# Patient Record
Sex: Female | Born: 1935 | Race: White | Hispanic: No | Marital: Married | Smoking: Never smoker
Health system: Southern US, Community
[De-identification: ages and names within clinical notes are randomized; demographics above are authoritative.]

## PROBLEM LIST (undated history)

## (undated) DIAGNOSIS — I10 Essential (primary) hypertension: Secondary | ICD-10-CM

## (undated) DIAGNOSIS — Z853 Personal history of malignant neoplasm of breast: Secondary | ICD-10-CM

## (undated) DIAGNOSIS — T7840XA Allergy, unspecified, initial encounter: Secondary | ICD-10-CM

## (undated) DIAGNOSIS — C801 Malignant (primary) neoplasm, unspecified: Secondary | ICD-10-CM

## (undated) DIAGNOSIS — I639 Cerebral infarction, unspecified: Secondary | ICD-10-CM

## (undated) DIAGNOSIS — I82409 Acute embolism and thrombosis of unspecified deep veins of unspecified lower extremity: Secondary | ICD-10-CM

## (undated) DIAGNOSIS — M1990 Unspecified osteoarthritis, unspecified site: Secondary | ICD-10-CM

## (undated) DIAGNOSIS — I519 Heart disease, unspecified: Secondary | ICD-10-CM

## (undated) HISTORY — DX: Malignant (primary) neoplasm, unspecified: C80.1

## (undated) HISTORY — PX: WRIST SURGERY: SHX841

## (undated) HISTORY — DX: Heart disease, unspecified: I51.9

## (undated) HISTORY — PX: ABDOMINAL HYSTERECTOMY: SHX81

## (undated) HISTORY — DX: Personal history of malignant neoplasm of breast: Z85.3

## (undated) HISTORY — DX: Essential (primary) hypertension: I10

## (undated) HISTORY — PX: PHRENIC NERVE PACEMAKER IMPLANTATION: SHX2237

## (undated) HISTORY — PX: HIP SURGERY: SHX245

## (undated) HISTORY — DX: Acute embolism and thrombosis of unspecified deep veins of unspecified lower extremity: I82.409

## (undated) HISTORY — PX: APPENDECTOMY: SHX54

## (undated) HISTORY — DX: Allergy, unspecified, initial encounter: T78.40XA

## (undated) HISTORY — DX: Unspecified osteoarthritis, unspecified site: M19.90

## (undated) HISTORY — DX: Cerebral infarction, unspecified: I63.9

---

## 2003-05-28 HISTORY — PX: COLONOSCOPY: SHX174

## 2003-05-28 HISTORY — PX: BREAST SURGERY: SHX581

## 2003-12-08 DIAGNOSIS — C801 Malignant (primary) neoplasm, unspecified: Secondary | ICD-10-CM

## 2003-12-08 HISTORY — DX: Malignant (primary) neoplasm, unspecified: C80.1

## 2004-02-27 ENCOUNTER — Ambulatory Visit: Payer: Self-pay | Admitting: Radiation Oncology

## 2004-03-22 ENCOUNTER — Ambulatory Visit: Payer: Self-pay | Admitting: General Surgery

## 2004-03-27 ENCOUNTER — Ambulatory Visit: Payer: Self-pay | Admitting: Radiation Oncology

## 2004-06-04 ENCOUNTER — Ambulatory Visit: Payer: Self-pay | Admitting: General Surgery

## 2004-07-18 ENCOUNTER — Ambulatory Visit: Payer: Self-pay | Admitting: Ophthalmology

## 2004-07-24 ENCOUNTER — Ambulatory Visit: Payer: Self-pay | Admitting: Ophthalmology

## 2004-07-30 ENCOUNTER — Ambulatory Visit: Payer: Self-pay | Admitting: Radiation Oncology

## 2004-11-26 ENCOUNTER — Ambulatory Visit: Payer: Self-pay | Admitting: General Surgery

## 2004-12-13 ENCOUNTER — Observation Stay: Payer: Self-pay | Admitting: Internal Medicine

## 2004-12-13 ENCOUNTER — Other Ambulatory Visit: Payer: Self-pay

## 2005-01-30 ENCOUNTER — Ambulatory Visit: Payer: Self-pay | Admitting: Radiation Oncology

## 2005-05-23 ENCOUNTER — Other Ambulatory Visit: Payer: Self-pay

## 2005-05-27 DIAGNOSIS — I82409 Acute embolism and thrombosis of unspecified deep veins of unspecified lower extremity: Secondary | ICD-10-CM

## 2005-05-27 HISTORY — DX: Acute embolism and thrombosis of unspecified deep veins of unspecified lower extremity: I82.409

## 2005-05-27 HISTORY — PX: KNEE SURGERY: SHX244

## 2005-05-28 ENCOUNTER — Inpatient Hospital Stay: Payer: Self-pay | Admitting: Specialist

## 2005-06-01 ENCOUNTER — Encounter: Payer: Self-pay | Admitting: Internal Medicine

## 2005-06-10 ENCOUNTER — Ambulatory Visit: Payer: Self-pay | Admitting: Internal Medicine

## 2005-06-27 ENCOUNTER — Encounter: Payer: Self-pay | Admitting: Internal Medicine

## 2005-07-29 ENCOUNTER — Ambulatory Visit: Payer: Self-pay | Admitting: General Surgery

## 2006-01-30 ENCOUNTER — Ambulatory Visit: Payer: Self-pay | Admitting: Radiation Oncology

## 2006-01-31 ENCOUNTER — Ambulatory Visit: Payer: Self-pay | Admitting: General Surgery

## 2006-03-06 ENCOUNTER — Other Ambulatory Visit: Payer: Self-pay

## 2006-03-06 ENCOUNTER — Inpatient Hospital Stay: Payer: Self-pay | Admitting: Specialist

## 2006-03-13 ENCOUNTER — Encounter: Payer: Self-pay | Admitting: Internal Medicine

## 2006-03-20 ENCOUNTER — Emergency Department: Payer: Self-pay | Admitting: Emergency Medicine

## 2006-03-27 ENCOUNTER — Encounter: Payer: Self-pay | Admitting: Internal Medicine

## 2006-03-31 ENCOUNTER — Ambulatory Visit: Payer: Self-pay | Admitting: Family Medicine

## 2006-04-07 ENCOUNTER — Ambulatory Visit: Payer: Self-pay | Admitting: Family Medicine

## 2006-04-09 ENCOUNTER — Ambulatory Visit: Payer: Self-pay | Admitting: Family Medicine

## 2006-04-11 ENCOUNTER — Ambulatory Visit: Payer: Self-pay | Admitting: Family Medicine

## 2006-04-15 ENCOUNTER — Ambulatory Visit: Payer: Self-pay | Admitting: Family Medicine

## 2006-04-21 ENCOUNTER — Ambulatory Visit: Payer: Self-pay | Admitting: Internal Medicine

## 2006-04-26 ENCOUNTER — Ambulatory Visit: Payer: Self-pay | Admitting: Internal Medicine

## 2006-04-26 ENCOUNTER — Encounter: Payer: Self-pay | Admitting: Internal Medicine

## 2006-05-27 ENCOUNTER — Encounter: Payer: Self-pay | Admitting: Internal Medicine

## 2006-05-27 ENCOUNTER — Ambulatory Visit: Payer: Self-pay | Admitting: Internal Medicine

## 2006-06-27 ENCOUNTER — Encounter: Payer: Self-pay | Admitting: Internal Medicine

## 2006-07-26 ENCOUNTER — Encounter: Payer: Self-pay | Admitting: Internal Medicine

## 2006-07-28 ENCOUNTER — Ambulatory Visit: Payer: Self-pay | Admitting: General Surgery

## 2006-08-06 ENCOUNTER — Ambulatory Visit: Payer: Self-pay | Admitting: Internal Medicine

## 2006-08-26 ENCOUNTER — Ambulatory Visit: Payer: Self-pay | Admitting: Internal Medicine

## 2006-08-26 ENCOUNTER — Encounter: Payer: Self-pay | Admitting: Internal Medicine

## 2006-09-25 ENCOUNTER — Encounter: Payer: Self-pay | Admitting: Internal Medicine

## 2006-10-26 ENCOUNTER — Encounter: Payer: Self-pay | Admitting: Internal Medicine

## 2006-11-25 ENCOUNTER — Encounter: Payer: Self-pay | Admitting: Internal Medicine

## 2006-12-26 ENCOUNTER — Encounter: Payer: Self-pay | Admitting: Internal Medicine

## 2007-01-05 ENCOUNTER — Ambulatory Visit: Payer: Self-pay | Admitting: General Surgery

## 2007-01-26 ENCOUNTER — Encounter: Payer: Self-pay | Admitting: Internal Medicine

## 2007-01-26 ENCOUNTER — Ambulatory Visit: Payer: Self-pay | Admitting: Internal Medicine

## 2007-02-04 ENCOUNTER — Ambulatory Visit: Payer: Self-pay | Admitting: Internal Medicine

## 2007-02-25 ENCOUNTER — Ambulatory Visit: Payer: Self-pay | Admitting: Internal Medicine

## 2007-02-25 ENCOUNTER — Encounter: Payer: Self-pay | Admitting: Internal Medicine

## 2007-03-28 ENCOUNTER — Encounter: Payer: Self-pay | Admitting: Internal Medicine

## 2007-04-27 ENCOUNTER — Encounter: Payer: Self-pay | Admitting: Internal Medicine

## 2007-06-27 IMAGING — US US EXTREM LOW VENOUS*L*
1 series · 17 of 22 positions shown · non-contrast
Comparison: none

REASON FOR EXAM: DVT  POST OP  rm 1
COMMENTS:

PROCEDURE:     US  - US DOPPLER LOW EXTR LEFT  - March 21, 2006  [DATE]
RESULT:     Standard LEFT lower extremity Doppler reveals diffuse
non-occlusive thrombus throughout the LEFT lower extremity deep venous
system.  Thrombus is also noted in the saphenous vein.

[Series 1: us extrem low venous*left* · 17 of 22 slices shown]
[im 1/22]
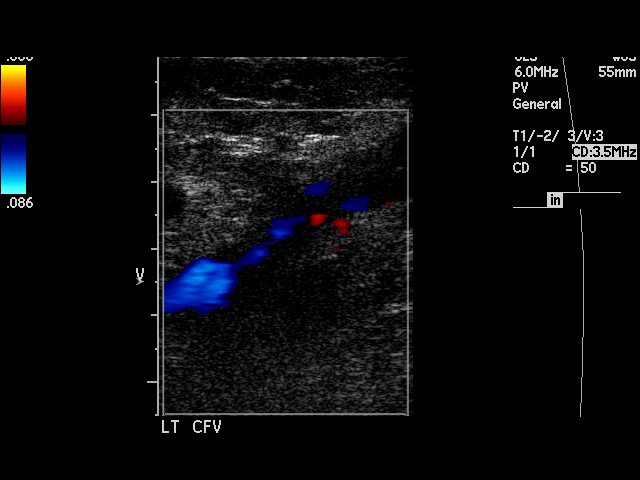
[im 2/22]
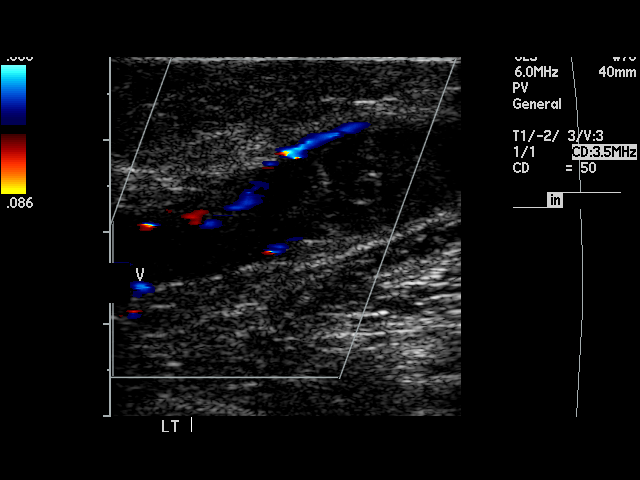
[im 4/22]
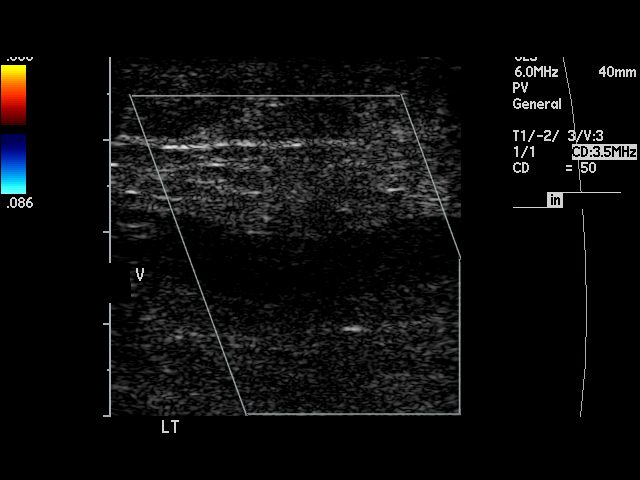
[im 5/22]
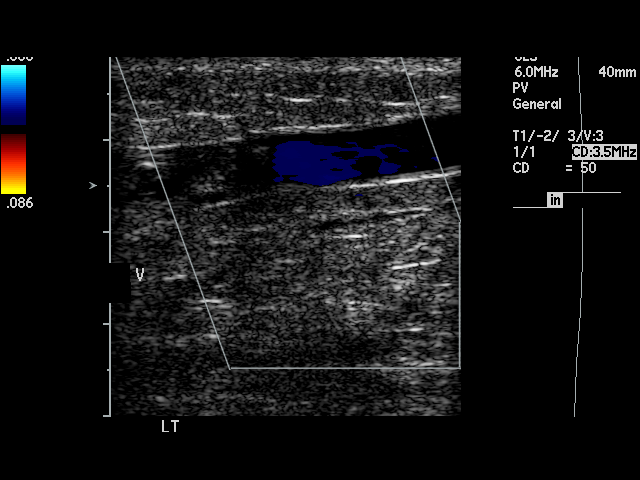
[im 6/22]
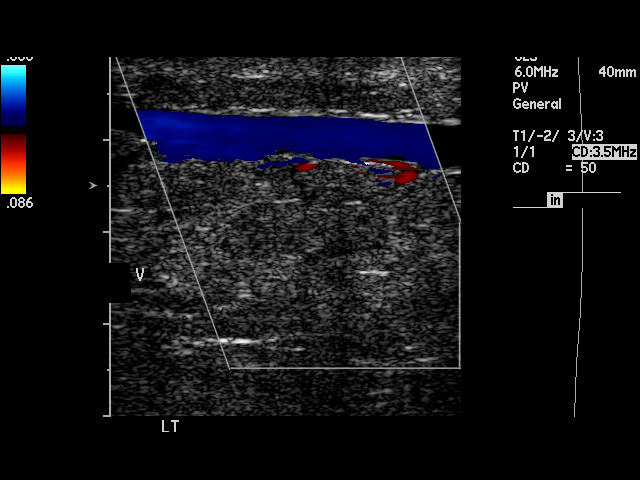
[im 8/22]
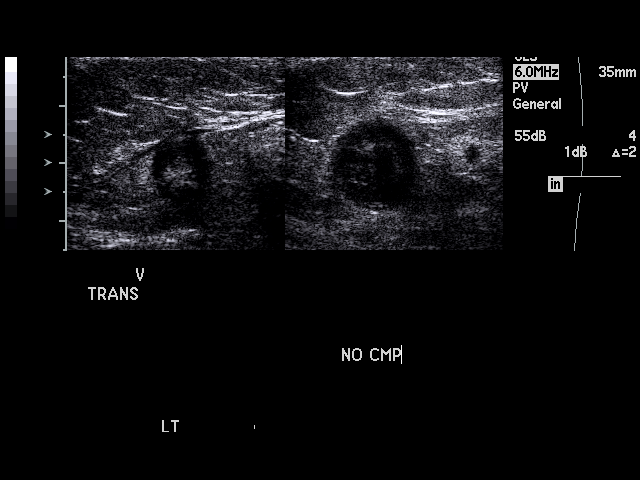
[im 9/22]
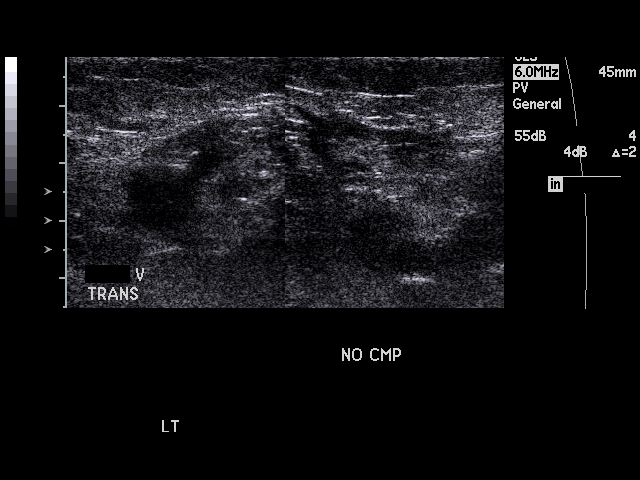
[im 10/22]
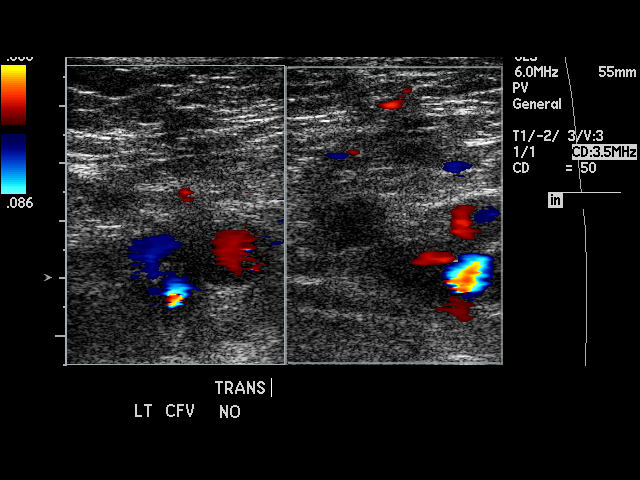
[im 12/22]
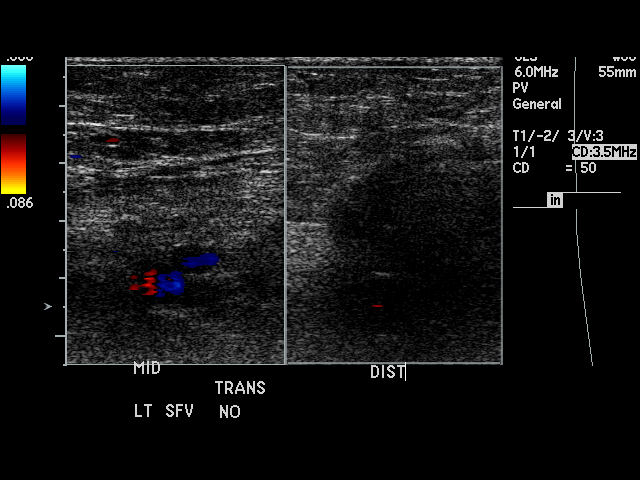
[im 13/22]
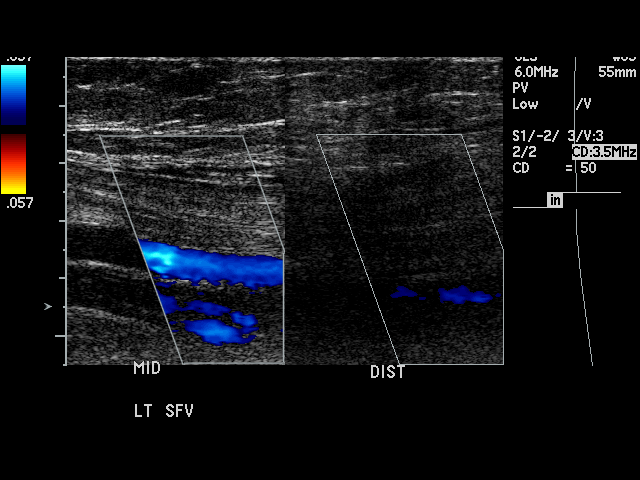
[im 14/22]
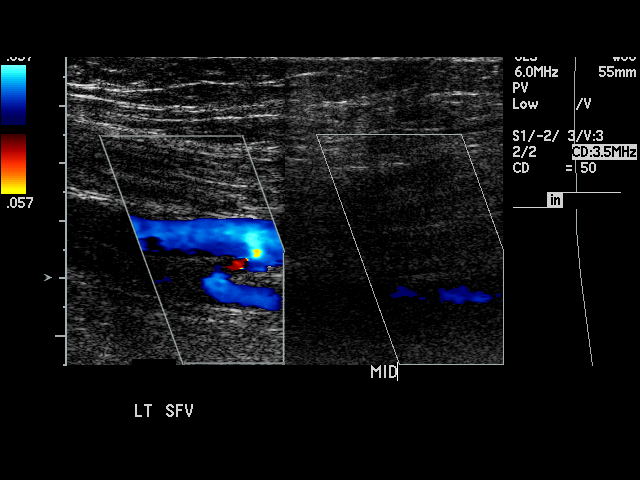
[im 15/22]
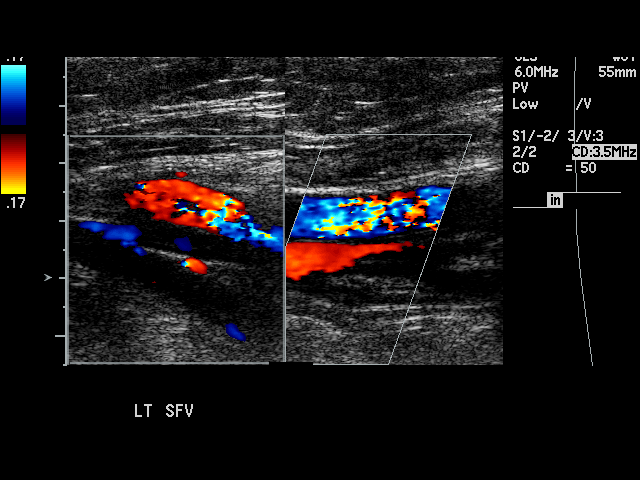
[im 17/22]
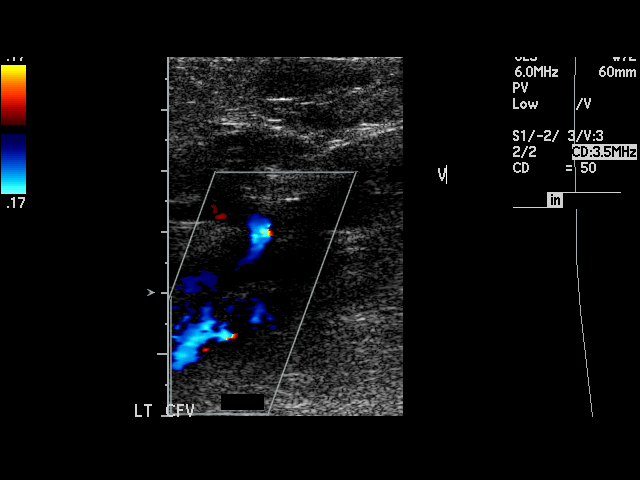
[im 18/22]
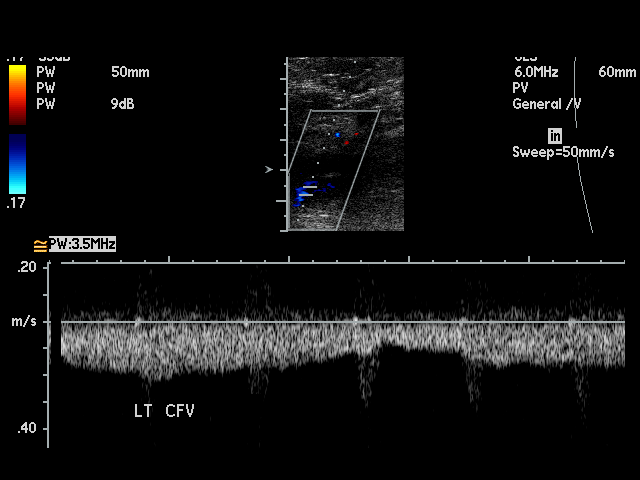
[im 19/22]
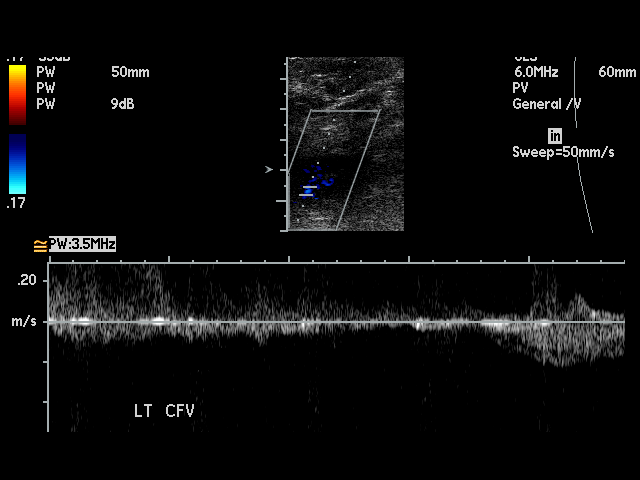
[im 21/22]
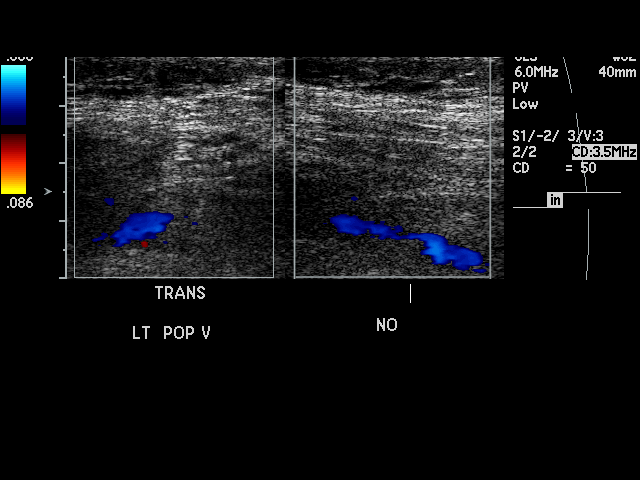
[im 22/22]
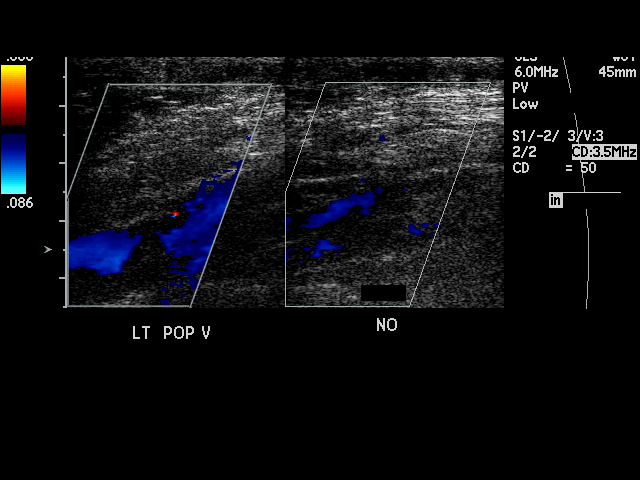

[17 of 22 positions shown; findings below may reference images not displayed]

IMPRESSION: 1)Positive study for diffuse deep venous thrombosis in the LEFT lower
extremity.  Saphenous venous thrombosis is also noted.

## 2007-07-26 ENCOUNTER — Ambulatory Visit: Payer: Self-pay | Admitting: Internal Medicine

## 2007-12-26 ENCOUNTER — Ambulatory Visit: Payer: Self-pay | Admitting: Radiation Oncology

## 2007-12-30 ENCOUNTER — Ambulatory Visit: Payer: Self-pay | Admitting: General Surgery

## 2008-07-26 ENCOUNTER — Ambulatory Visit: Payer: Self-pay | Admitting: Family Medicine

## 2009-01-13 ENCOUNTER — Ambulatory Visit: Payer: Self-pay | Admitting: Family Medicine

## 2009-01-13 ENCOUNTER — Ambulatory Visit: Payer: Self-pay | Admitting: General Surgery

## 2009-02-24 ENCOUNTER — Emergency Department: Payer: Self-pay

## 2009-05-27 DIAGNOSIS — I519 Heart disease, unspecified: Secondary | ICD-10-CM

## 2009-05-27 HISTORY — DX: Heart disease, unspecified: I51.9

## 2009-05-27 HISTORY — PX: VENA CAVA FILTER PLACEMENT: SUR1032

## 2009-10-13 ENCOUNTER — Emergency Department: Payer: Self-pay

## 2009-10-16 ENCOUNTER — Inpatient Hospital Stay: Payer: Self-pay | Admitting: Internal Medicine

## 2009-11-03 ENCOUNTER — Encounter: Payer: Self-pay | Admitting: Internal Medicine

## 2009-11-24 ENCOUNTER — Encounter: Payer: Self-pay | Admitting: Internal Medicine

## 2009-11-28 ENCOUNTER — Ambulatory Visit: Payer: Self-pay | Admitting: Unknown Physician Specialty

## 2009-11-30 ENCOUNTER — Ambulatory Visit: Payer: Self-pay | Admitting: Unknown Physician Specialty

## 2010-01-11 ENCOUNTER — Ambulatory Visit: Payer: Self-pay | Admitting: General Surgery

## 2010-05-10 ENCOUNTER — Ambulatory Visit: Payer: Self-pay | Admitting: Internal Medicine

## 2011-01-14 ENCOUNTER — Ambulatory Visit: Payer: Self-pay | Admitting: General Surgery

## 2011-02-01 IMAGING — CR DG CHEST 1V PORT
1 series · 1 of 1 positions shown · non-contrast
Comparison: none

REASON FOR EXAM: f/u opacified left hemithorax, hypoxia
COMMENTS:

[view not recorded]
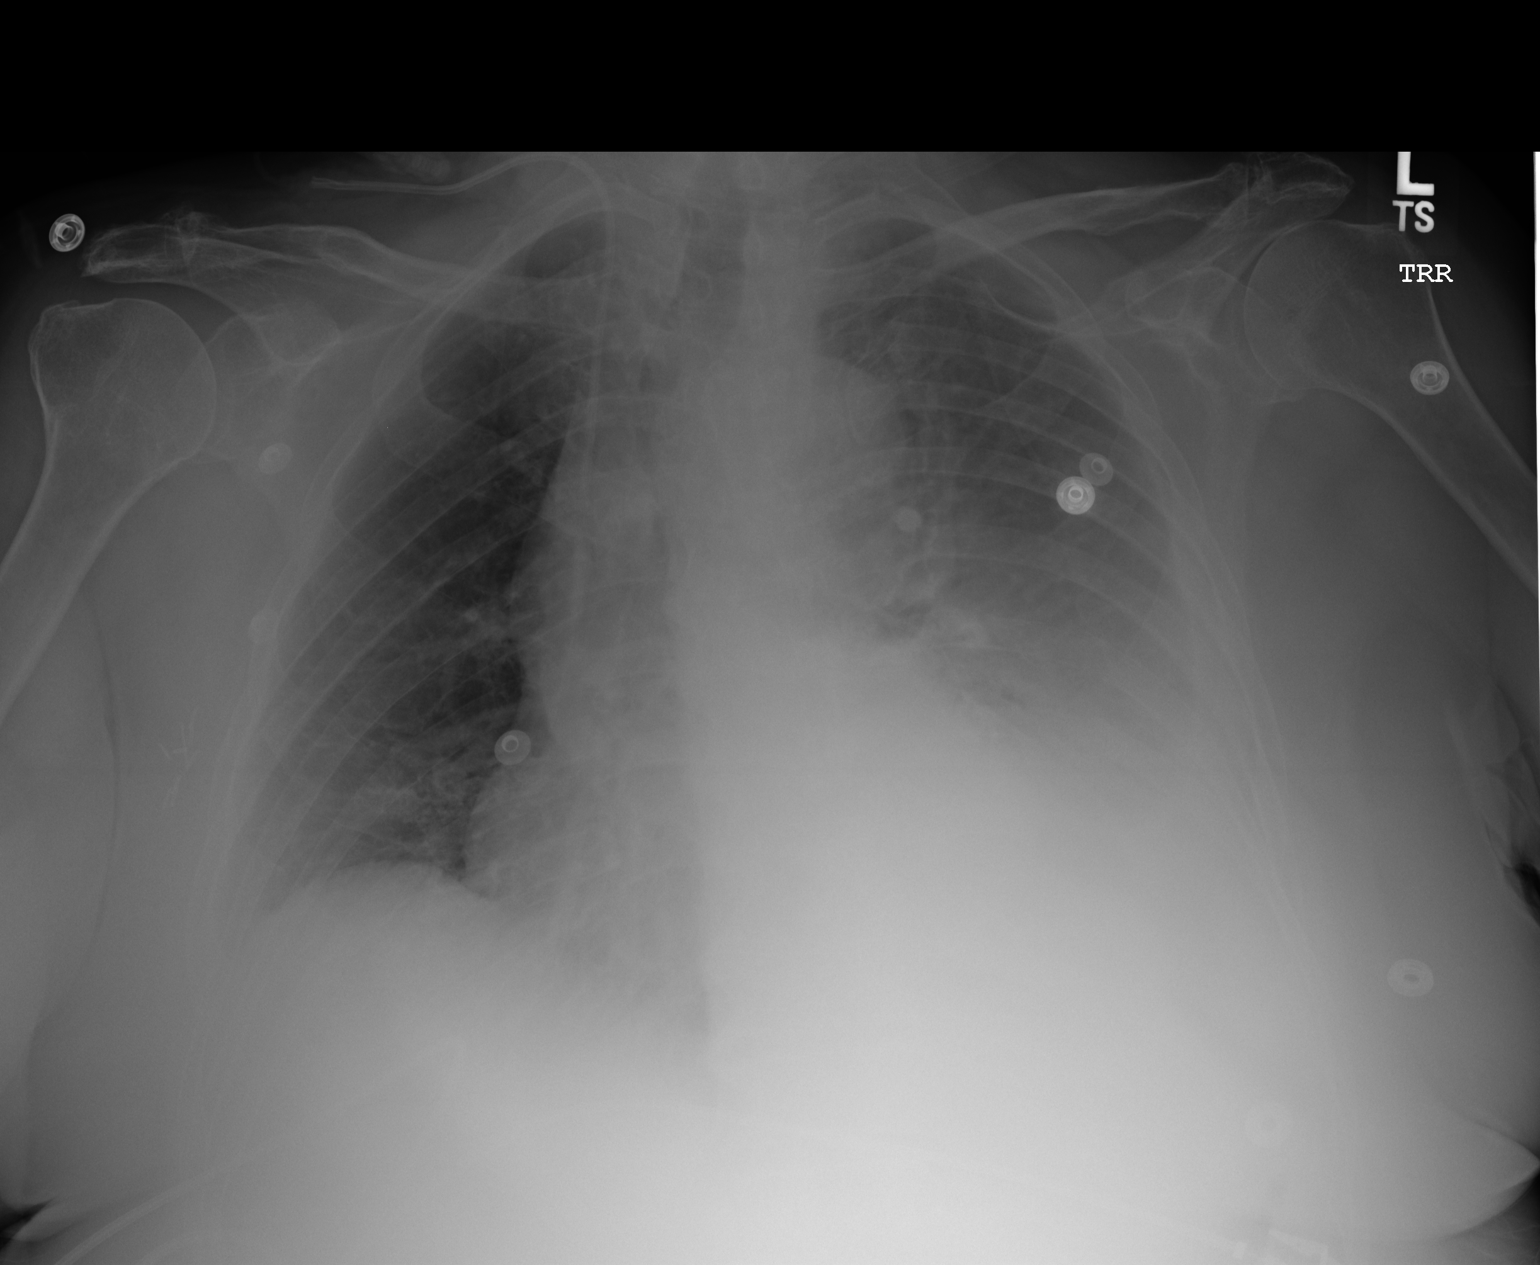

[1 of 1 positions shown; findings below may reference images not displayed]

PROCEDURE:     DXR - DXR PORTABLE CHEST SINGLE VIEW  - October 26, 2009  [DATE]

RESULT:     Comparison is made to study 25 October, 1998 the 11.

The left hemithorax remains increased in density diffusely. The left
hemidiaphragm remains obscured. The right lung is reasonably well inflated.
It is slightly less clear today with mildly increased interstitial markings.
The right internal jugular venous catheter appears unchanged
IMPRESSION: There remains increased density in the left hemithorax.
There has not been significant interval change since the previous study. The
right lung demonstrates slightly increased interstitial density. A followup
PA and lateral chest x-ray would be of value.

## 2012-01-20 ENCOUNTER — Ambulatory Visit: Payer: Self-pay | Admitting: General Surgery

## 2012-09-01 ENCOUNTER — Emergency Department: Payer: Self-pay | Admitting: Emergency Medicine

## 2012-09-01 LAB — BASIC METABOLIC PANEL
BUN: 22 mg/dL — ABNORMAL HIGH (ref 7–18)
Calcium, Total: 9.4 mg/dL (ref 8.5–10.1)
Chloride: 110 mmol/L — ABNORMAL HIGH (ref 98–107)
Creatinine: 0.94 mg/dL (ref 0.60–1.30)
EGFR (African American): >60
Glucose: 112 mg/dL — ABNORMAL HIGH (ref 65–99)

## 2012-09-01 LAB — PROTIME-INR
INR: 1
Prothrombin Time: 13.4 secs (ref 11.5–14.7)

## 2012-10-23 ENCOUNTER — Encounter: Payer: Self-pay | Admitting: *Deleted

## 2013-01-26 ENCOUNTER — Ambulatory Visit: Payer: Self-pay | Admitting: General Surgery

## 2013-01-28 ENCOUNTER — Encounter: Payer: Self-pay | Admitting: General Surgery

## 2013-02-02 ENCOUNTER — Ambulatory Visit: Payer: Self-pay | Admitting: General Surgery

## 2013-02-03 ENCOUNTER — Ambulatory Visit (INDEPENDENT_AMBULATORY_CARE_PROVIDER_SITE_OTHER): Payer: Medicare Other | Admitting: General Surgery

## 2013-02-03 ENCOUNTER — Encounter: Payer: Self-pay | Admitting: General Surgery

## 2013-02-03 VITALS — BP 136/68 | HR 78 | Resp 18 | Ht 63.0 in | Wt 208.0 lb

## 2013-02-03 DIAGNOSIS — Z853 Personal history of malignant neoplasm of breast: Secondary | ICD-10-CM

## 2013-02-03 NOTE — Patient Instructions (Signed)
Patient to return in 1 year with a bilateral screening mammogram.

## 2013-02-03 NOTE — Progress Notes (Signed)
Patient ID: Rose Reynolds, female   DOB: 04/24/1920, 79 y.o.   MRN: [id number]  Chief Complaint  Patient presents with  . Other    mammogram    HPI Rose Reynolds is a 79 y.o. female who presents for a breast evaluation. The most recent mammogram was done on 01/26/13. Patient does perform regular self breast checks and gets regular mammograms done.  No new problems at this time. The patient reports significant decrease in drainage from the chronic wound in the right breast.  HPI  Past Medical History  Diagnosis Date  . Allergy   . Cancer   . Hypertension   . Deep vein thrombosis 2007  . Arthritis   . Stroke   . Personal history of malignant neoplasm of breast   . Cardiac disease 2011    Past Surgical History  Procedure Laterality Date  . Colonoscopy  2005  . Hip surgery Left   . Knee surgery Left 2007  . Breast surgery Right 2005  . Wrist surgery    . Abdominal hysterectomy    . Appendectomy    . Vena cava filter placement  2011  . Phrenic nerve pacemaker implantation      History reviewed. No pertinent family history.  Social History History  Substance Use Topics  . Smoking status: Never Smoker   . Smokeless tobacco: Never Used  . Alcohol Use: No    Allergies  Allergen Reactions  . Codeine     Breathing problems  . Sulfa Antibiotics     breathing problems     Current Outpatient Prescriptions  Medication Sig Dispense Refill  . atorvastatin (LIPITOR) 20 MG tablet Take 1 tablet by mouth daily.      . metoprolol (LOPRESSOR) 50 MG tablet Take 1 tablet by mouth daily.      . ranitidine (ZANTAC) 150 MG tablet Take 1 tablet by mouth daily.       No current facility-administered medications for this visit.    Review of Systems Review of Systems  Constitutional: Negative.   Respiratory: Negative.   Cardiovascular: Negative.     Blood pressure 136/68, pulse 78, resp. rate 18, height 5\' 3"  (1.6 m), weight 208 lb (94.348 kg).  Physical Exam Physical Exam   Constitutional: She is oriented to person, place, and time. She appears well-developed and well-nourished.  Neck: No thyromegaly present.  Cardiovascular: Normal rate, regular rhythm and normal heart sounds.   No murmur heard. Pulmonary/Chest: Effort normal and breath sounds normal. Right breast exhibits no inverted nipple, no mass, no nipple discharge, no skin change and no tenderness. Left breast exhibits no inverted nipple, no mass, no nipple discharge, no skin change and no tenderness.  Surgical deformity at 9 o'clock of right breast. The area is significantly softer on past exams and shows no inflammatory component. This is the area of excision of chronic calcifications completed on 03/26/2010.  Lymphadenopathy:    She has no cervical adenopathy.    She has no axillary adenopathy.  Neurological: She is alert and oriented to person, place, and time.  Skin: Skin is warm and dry.    Data Reviewed Bilateral mammograms dated 01/26/2013 showed changes of previous surgery with no suspicion for malignancy. BI-RAD-1.  Assessment    Stable breast exam    Plan    The patient desires to continue annual screening mammograms. These will be scheduled in one year per       Rose Reynolds 02/04/2013, 8:08 AM

## 2013-02-04 ENCOUNTER — Encounter: Payer: Self-pay | Admitting: General Surgery

## 2013-02-17 ENCOUNTER — Encounter: Payer: Self-pay | Admitting: General Surgery

## 2013-10-19 ENCOUNTER — Emergency Department: Payer: Self-pay | Admitting: Emergency Medicine

## 2013-10-19 LAB — COMPREHENSIVE METABOLIC PANEL
ALBUMIN: 3.7 g/dL (ref 3.4–5.0)
ALK PHOS: 65 U/L
Anion Gap: 6 — ABNORMAL LOW (ref 7–16)
BUN: 15 mg/dL (ref 7–18)
Bilirubin,Total: 0.5 mg/dL (ref 0.2–1.0)
CHLORIDE: 111 mmol/L — AB (ref 98–107)
CO2: 24 mmol/L (ref 21–32)
Calcium, Total: 9.2 mg/dL (ref 8.5–10.1)
Creatinine: 0.98 mg/dL (ref 0.60–1.30)
EGFR (African American): 58 — ABNORMAL LOW
EGFR (Non-African Amer.): 50 — ABNORMAL LOW
Glucose: 164 mg/dL — ABNORMAL HIGH (ref 65–99)
OSMOLALITY: 286 (ref 275–301)
Potassium: 4 mmol/L (ref 3.5–5.1)
SGOT(AST): 32 U/L (ref 15–37)
SGPT (ALT): 30 U/L (ref 12–78)
SODIUM: 141 mmol/L (ref 136–145)
TOTAL PROTEIN: 6.9 g/dL (ref 6.4–8.2)

## 2013-10-19 LAB — CBC
HCT: 43.1 % (ref 35.0–47.0)
HGB: 13.9 g/dL (ref 12.0–16.0)
MCH: 30.3 pg (ref 26.0–34.0)
MCHC: 32.4 g/dL (ref 32.0–36.0)
MCV: 94 fL (ref 80–100)
Platelet: 127 10*3/uL — ABNORMAL LOW (ref 150–440)
RBC: 4.6 10*6/uL (ref 3.80–5.20)
RDW: 14.3 % (ref 11.5–14.5)
WBC: 12.2 10*3/uL — ABNORMAL HIGH (ref 3.6–11.0)

## 2013-10-19 LAB — TROPONIN I: Troponin-I: <0.02 ng/mL

## 2013-10-19 LAB — LIPASE, BLOOD: Lipase: 113 U/L (ref 73–393)

## 2013-10-22 ENCOUNTER — Inpatient Hospital Stay: Payer: Self-pay | Admitting: Internal Medicine

## 2013-10-22 LAB — COMPREHENSIVE METABOLIC PANEL
ALK PHOS: 68 U/L
AST: 47 U/L — AB (ref 15–37)
Albumin: 3.6 g/dL (ref 3.4–5.0)
Anion Gap: 5 — ABNORMAL LOW (ref 7–16)
BILIRUBIN TOTAL: 0.7 mg/dL (ref 0.2–1.0)
BUN: 42 mg/dL — ABNORMAL HIGH (ref 7–18)
CO2: 29 mmol/L (ref 21–32)
CREATININE: 1.52 mg/dL — AB (ref 0.60–1.30)
Calcium, Total: 9.3 mg/dL (ref 8.5–10.1)
Chloride: 108 mmol/L — ABNORMAL HIGH (ref 98–107)
EGFR (African American): 34 — ABNORMAL LOW
EGFR (Non-African Amer.): 29 — ABNORMAL LOW
Glucose: 132 mg/dL — ABNORMAL HIGH (ref 65–99)
OSMOLALITY: 295 (ref 275–301)
Potassium: 4.3 mmol/L (ref 3.5–5.1)
SGPT (ALT): 48 U/L (ref 12–78)
Sodium: 142 mmol/L (ref 136–145)
Total Protein: 7.6 g/dL (ref 6.4–8.2)

## 2013-10-22 LAB — CBC WITH DIFFERENTIAL/PLATELET
Basophil #: 0.1 10*3/uL (ref 0.0–0.1)
Basophil %: 0.5 %
Eosinophil #: 0 10*3/uL (ref 0.0–0.7)
Eosinophil %: 0.4 %
HCT: 45.2 % (ref 35.0–47.0)
HGB: 14.4 g/dL (ref 12.0–16.0)
LYMPHS ABS: 1.1 10*3/uL (ref 1.0–3.6)
Lymphocyte %: 9.2 %
MCH: 30.3 pg (ref 26.0–34.0)
MCHC: 31.9 g/dL — ABNORMAL LOW (ref 32.0–36.0)
MCV: 95 fL (ref 80–100)
MONOS PCT: 11.6 %
Monocyte #: 1.4 x10 3/mm — ABNORMAL HIGH (ref 0.2–0.9)
NEUTROS ABS: 9.2 10*3/uL — AB (ref 1.4–6.5)
Neutrophil %: 78.3 %
PLATELETS: 133 10*3/uL — AB (ref 150–440)
RBC: 4.76 10*6/uL (ref 3.80–5.20)
RDW: 14.6 % — ABNORMAL HIGH (ref 11.5–14.5)
WBC: 11.7 10*3/uL — AB (ref 3.6–11.0)

## 2013-10-22 LAB — PROTIME-INR
INR: 1.1
Prothrombin Time: 14.4 secs (ref 11.5–14.7)

## 2013-10-22 LAB — TROPONIN I: TROPONIN-I: 0.02 ng/mL

## 2013-10-22 LAB — MAGNESIUM: MAGNESIUM: 2.2 mg/dL

## 2013-10-22 LAB — APTT: ACTIVATED PTT: 25.8 s (ref 23.6–35.9)

## 2013-10-22 LAB — LIPASE, BLOOD: Lipase: 70 U/L — ABNORMAL LOW (ref 73–393)

## 2013-10-23 LAB — CBC WITH DIFFERENTIAL/PLATELET
Basophil #: 0.1 10*3/uL (ref 0.0–0.1)
Basophil %: 0.8 %
Eosinophil #: 0.2 10*3/uL (ref 0.0–0.7)
Eosinophil %: 2.3 %
HCT: 37.4 % (ref 35.0–47.0)
HGB: 12.1 g/dL (ref 12.0–16.0)
Lymphocyte #: 1.4 10*3/uL (ref 1.0–3.6)
Lymphocyte %: 16.8 %
MCH: 30.5 pg (ref 26.0–34.0)
MCHC: 32.3 g/dL (ref 32.0–36.0)
MCV: 95 fL (ref 80–100)
MONO ABS: 1 x10 3/mm — AB (ref 0.2–0.9)
MONOS PCT: 12.8 %
NEUTROS PCT: 67.3 %
Neutrophil #: 5.4 10*3/uL (ref 1.4–6.5)
Platelet: 106 10*3/uL — ABNORMAL LOW (ref 150–440)
RBC: 3.96 10*6/uL (ref 3.80–5.20)
RDW: 14.6 % — ABNORMAL HIGH (ref 11.5–14.5)
WBC: 8.1 10*3/uL (ref 3.6–11.0)

## 2013-10-23 LAB — BASIC METABOLIC PANEL
Anion Gap: 3 — ABNORMAL LOW (ref 7–16)
BUN: 38 mg/dL — ABNORMAL HIGH (ref 7–18)
CALCIUM: 8.3 mg/dL — AB (ref 8.5–10.1)
CHLORIDE: 113 mmol/L — AB (ref 98–107)
CO2: 29 mmol/L (ref 21–32)
CREATININE: 1.13 mg/dL (ref 0.60–1.30)
EGFR (Non-African Amer.): 42 — ABNORMAL LOW
GFR CALC AF AMER: 49 — AB
GLUCOSE: 95 mg/dL (ref 65–99)
Osmolality: 298 (ref 275–301)
Potassium: 4.1 mmol/L (ref 3.5–5.1)
SODIUM: 145 mmol/L (ref 136–145)

## 2013-10-23 LAB — URINALYSIS, COMPLETE
Bacteria: NONE SEEN
Bilirubin,UR: NEGATIVE
Glucose,UR: NEGATIVE mg/dL (ref 0–75)
Ketone: NEGATIVE
Nitrite: NEGATIVE
PH: 5 (ref 4.5–8.0)
PROTEIN: NEGATIVE
Specific Gravity: 1.024 (ref 1.003–1.030)
Squamous Epithelial: 1
Transitional Epi: 1
WBC UR: 76 /HPF (ref 0–5)

## 2013-10-25 ENCOUNTER — Encounter: Payer: Self-pay | Admitting: Internal Medicine

## 2013-11-24 ENCOUNTER — Encounter: Payer: Self-pay | Admitting: Internal Medicine

## 2014-02-23 ENCOUNTER — Ambulatory Visit: Payer: Self-pay | Admitting: General Surgery

## 2014-02-24 ENCOUNTER — Encounter: Payer: Self-pay | Admitting: General Surgery

## 2014-03-01 ENCOUNTER — Ambulatory Visit: Payer: Medicare Other | Admitting: General Surgery

## 2014-03-28 ENCOUNTER — Encounter: Payer: Self-pay | Admitting: General Surgery

## 2014-04-13 ENCOUNTER — Encounter: Payer: Self-pay | Admitting: *Deleted
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
## 2018-08-26 DEATH — deceased
